# Patient Record
Sex: Female | Born: 1950 | Race: White | Hispanic: No | Marital: Married
Health system: Southern US, Community
[De-identification: ages and names within clinical notes are randomized; demographics above are authoritative.]

---

## 1994-09-28 HISTORY — PX: BREAST BIOPSY: SHX20

## 2005-03-05 ENCOUNTER — Ambulatory Visit: Payer: Self-pay | Admitting: Internal Medicine

## 2005-03-14 ENCOUNTER — Emergency Department: Payer: Self-pay | Admitting: Emergency Medicine

## 2006-05-26 ENCOUNTER — Ambulatory Visit: Payer: Self-pay | Admitting: Obstetrics and Gynecology

## 2007-06-09 ENCOUNTER — Ambulatory Visit: Payer: Self-pay | Admitting: Obstetrics and Gynecology

## 2007-07-01 ENCOUNTER — Ambulatory Visit: Payer: Self-pay | Admitting: Obstetrics and Gynecology

## 2008-06-13 ENCOUNTER — Ambulatory Visit: Payer: Self-pay | Admitting: Obstetrics and Gynecology

## 2009-10-02 ENCOUNTER — Ambulatory Visit: Payer: Self-pay | Admitting: Internal Medicine

## 2010-10-08 ENCOUNTER — Ambulatory Visit: Payer: Self-pay | Admitting: Internal Medicine

## 2010-10-08 ENCOUNTER — Ambulatory Visit: Payer: Self-pay | Admitting: Obstetrics and Gynecology

## 2011-12-09 ENCOUNTER — Ambulatory Visit: Payer: Self-pay | Admitting: Internal Medicine

## 2013-03-09 ENCOUNTER — Ambulatory Visit: Payer: Self-pay | Admitting: Internal Medicine

## 2013-09-15 ENCOUNTER — Ambulatory Visit: Payer: Self-pay | Admitting: Unknown Physician Specialty

## 2014-07-16 ENCOUNTER — Ambulatory Visit: Payer: Self-pay | Admitting: Internal Medicine

## 2015-01-07 ENCOUNTER — Telehealth: Payer: Self-pay | Admitting: Pulmonary Disease

## 2015-05-09 ENCOUNTER — Other Ambulatory Visit: Payer: Self-pay | Admitting: Internal Medicine

## 2015-05-09 DIAGNOSIS — Z1231 Encounter for screening mammogram for malignant neoplasm of breast: Secondary | ICD-10-CM

## 2015-07-18 ENCOUNTER — Ambulatory Visit: Payer: Self-pay

## 2015-07-26 ENCOUNTER — Ambulatory Visit
Admission: RE | Admit: 2015-07-26 | Discharge: 2015-07-26 | Disposition: A | Discharge status: Home / Self Care | Payer: BLUE CROSS/BLUE SHIELD | Source: Ambulatory Visit | Attending: Internal Medicine | Admitting: Internal Medicine

## 2015-07-26 DIAGNOSIS — Z1231 Encounter for screening mammogram for malignant neoplasm of breast: Secondary | ICD-10-CM | POA: Insufficient documentation

## 2016-07-03 ENCOUNTER — Other Ambulatory Visit: Payer: Self-pay | Admitting: Internal Medicine

## 2016-07-03 DIAGNOSIS — Z1231 Encounter for screening mammogram for malignant neoplasm of breast: Secondary | ICD-10-CM

## 2016-08-10 ENCOUNTER — Ambulatory Visit
Admission: RE | Admit: 2016-08-10 | Discharge: 2016-08-10 | Disposition: A | Discharge status: Home / Self Care | Payer: BLUE CROSS/BLUE SHIELD | Source: Ambulatory Visit | Attending: Internal Medicine | Admitting: Internal Medicine

## 2016-08-10 ENCOUNTER — Ambulatory Visit: Payer: BLUE CROSS/BLUE SHIELD

## 2016-08-10 DIAGNOSIS — Z1231 Encounter for screening mammogram for malignant neoplasm of breast: Secondary | ICD-10-CM

## 2016-09-15 ENCOUNTER — Ambulatory Visit: Payer: BLUE CROSS/BLUE SHIELD

## 2017-06-21 ENCOUNTER — Other Ambulatory Visit: Payer: Self-pay | Admitting: Internal Medicine

## 2017-06-21 DIAGNOSIS — Z1231 Encounter for screening mammogram for malignant neoplasm of breast: Secondary | ICD-10-CM

## 2017-08-11 ENCOUNTER — Ambulatory Visit
Admission: RE | Admit: 2017-08-11 | Discharge: 2017-08-11 | Disposition: A | Discharge status: Home / Self Care | Payer: BLUE CROSS/BLUE SHIELD | Source: Ambulatory Visit | Attending: Internal Medicine | Admitting: Internal Medicine

## 2017-08-11 DIAGNOSIS — Z1231 Encounter for screening mammogram for malignant neoplasm of breast: Secondary | ICD-10-CM

## 2018-07-12 ENCOUNTER — Other Ambulatory Visit: Payer: Self-pay | Admitting: Internal Medicine

## 2018-07-12 DIAGNOSIS — Z1231 Encounter for screening mammogram for malignant neoplasm of breast: Secondary | ICD-10-CM

## 2018-08-12 ENCOUNTER — Ambulatory Visit
Admission: RE | Admit: 2018-08-12 | Discharge: 2018-08-12 | Disposition: A | Discharge status: Home / Self Care | Payer: Medicare Other | Source: Ambulatory Visit | Attending: Internal Medicine | Admitting: Internal Medicine

## 2018-08-12 DIAGNOSIS — Z1231 Encounter for screening mammogram for malignant neoplasm of breast: Secondary | ICD-10-CM | POA: Insufficient documentation

## 2018-12-06 ENCOUNTER — Other Ambulatory Visit: Payer: Self-pay | Admitting: Internal Medicine

## 2018-12-06 DIAGNOSIS — M25862 Other specified joint disorders, left knee: Secondary | ICD-10-CM

## 2018-12-07 ENCOUNTER — Other Ambulatory Visit: Payer: Self-pay

## 2018-12-07 ENCOUNTER — Ambulatory Visit
Admission: RE | Admit: 2018-12-07 | Discharge: 2018-12-07 | Disposition: A | Discharge status: Home / Self Care | Payer: Medicare Other | Source: Ambulatory Visit | Attending: Internal Medicine | Admitting: Internal Medicine

## 2018-12-07 DIAGNOSIS — M25862 Other specified joint disorders, left knee: Secondary | ICD-10-CM | POA: Diagnosis present

## 2019-07-14 ENCOUNTER — Other Ambulatory Visit: Payer: Self-pay | Admitting: Internal Medicine

## 2019-07-14 DIAGNOSIS — Z1231 Encounter for screening mammogram for malignant neoplasm of breast: Secondary | ICD-10-CM

## 2019-08-14 ENCOUNTER — Ambulatory Visit
Admission: RE | Admit: 2019-08-14 | Discharge: 2019-08-14 | Disposition: A | Discharge status: Home / Self Care | Payer: Medicare Other | Source: Ambulatory Visit | Attending: Internal Medicine | Admitting: Internal Medicine

## 2019-08-14 DIAGNOSIS — Z1231 Encounter for screening mammogram for malignant neoplasm of breast: Secondary | ICD-10-CM

## 2019-10-28 ENCOUNTER — Ambulatory Visit: Payer: Medicare Other

## 2019-11-02 ENCOUNTER — Ambulatory Visit: Payer: Medicare Other

## 2020-07-03 ENCOUNTER — Other Ambulatory Visit: Payer: Medicare Other

## 2020-07-03 DIAGNOSIS — Z20822 Contact with and (suspected) exposure to covid-19: Secondary | ICD-10-CM

## 2020-07-04 LAB — SARS-COV-2, NAA 2 DAY TAT

## 2020-07-04 LAB — NOVEL CORONAVIRUS, NAA: SARS-CoV-2, NAA: NOT DETECTED

## 2020-07-23 ENCOUNTER — Other Ambulatory Visit: Payer: Self-pay | Admitting: Internal Medicine

## 2020-07-23 DIAGNOSIS — Z1231 Encounter for screening mammogram for malignant neoplasm of breast: Secondary | ICD-10-CM

## 2020-08-19 ENCOUNTER — Other Ambulatory Visit: Payer: Self-pay

## 2020-08-19 ENCOUNTER — Ambulatory Visit
Admission: RE | Admit: 2020-08-19 | Discharge: 2020-08-19 | Disposition: A | Discharge status: Home / Self Care | Payer: Medicare Other | Source: Ambulatory Visit | Attending: Internal Medicine | Admitting: Internal Medicine

## 2020-08-19 DIAGNOSIS — Z1231 Encounter for screening mammogram for malignant neoplasm of breast: Secondary | ICD-10-CM | POA: Diagnosis not present

## 2021-07-25 ENCOUNTER — Other Ambulatory Visit: Payer: Self-pay | Admitting: Internal Medicine

## 2021-07-25 DIAGNOSIS — Z1231 Encounter for screening mammogram for malignant neoplasm of breast: Secondary | ICD-10-CM

## 2021-08-25 ENCOUNTER — Ambulatory Visit
Admission: RE | Admit: 2021-08-25 | Discharge: 2021-08-25 | Disposition: A | Discharge status: Home / Self Care | Payer: Medicare Other | Source: Ambulatory Visit | Attending: Internal Medicine | Admitting: Internal Medicine

## 2021-08-25 ENCOUNTER — Other Ambulatory Visit: Payer: Self-pay

## 2021-08-25 DIAGNOSIS — Z1231 Encounter for screening mammogram for malignant neoplasm of breast: Secondary | ICD-10-CM | POA: Diagnosis not present

## 2022-03-30 ENCOUNTER — Emergency Department
Admission: EM | Admit: 2022-03-30 | Discharge: 2022-03-30 | Disposition: A | Discharge status: Home / Self Care | Payer: Medicare Other | Attending: Emergency Medicine | Admitting: Emergency Medicine

## 2022-03-30 ENCOUNTER — Other Ambulatory Visit: Payer: Self-pay

## 2022-03-30 ENCOUNTER — Emergency Department: Payer: Medicare Other

## 2022-03-30 DIAGNOSIS — Z808 Family history of malignant neoplasm of other organs or systems: Secondary | ICD-10-CM | POA: Insufficient documentation

## 2022-03-30 DIAGNOSIS — H5702 Anisocoria: Secondary | ICD-10-CM | POA: Insufficient documentation

## 2022-03-30 NOTE — ED Provider Notes (Signed)
[facility] Provider Note    Event Date/Time   First MD Initiated Contact with Patient 03/30/22 1004     (approximate)   History   Eye Problem   HPI  Stacy Alvarez is a 71 y.o. female presents to the emergency department for treatment and evaluation of left pupil being larger than the right pupil.  She has never noticed this before.  She is not having any other symptoms such as headache, change in vision, photophobia weakness, slurred speech, eye pain or eye redness.  She denies having had eye conditions such as glaucoma or ocular hypertension.  Her last eye exam was last July.  She states that one of the nurses on the neurology floor at [facility] noticed it.  Patient was there with her sister who has been diagnosed with brain cancer.  She states that one of the neurologist looked in her eyes and looked at her pupils and did not recommend that she go to the emergency department, but when she looked online to research what might cause a different sized pupils she became increasingly concerned.  History reviewed. No pertinent past medical history.   Physical Exam   Triage Vital Signs: ED Triage Vitals  Enc Vitals Group     BP 03/30/22 0954 136/71     Pulse Rate 03/30/22 0954 92     Resp 03/30/22 0954 16     Temp 03/30/22 0954 98.4 F (36.9 C)     Temp Source 03/30/22 0954 Oral     SpO2 03/30/22 0954 100 %     Weight --      Height --      Head Circumference --      Peak Flow --      Pain Score 03/30/22 0933 0     Pain Loc --      Pain Edu? --      Excl. in GC? --     Most recent vital signs: Vitals:   03/30/22 0954  BP: 136/71  Pulse: 92  Resp: 16  Temp: 98.4 F (36.9 C)  SpO2: 100%    General: Awake, no distress.  CV:  Good peripheral perfusion.  Resp:  Normal effort.  Abd:  No distention.  Other:  Left pupil 3 mm, right pupil 2 mm.  Both pupils are brisk.  No change in difference of pupil size in the light or dark.   ED Results  / Procedures / Treatments   Labs (all labs ordered are listed, but only abnormal results are displayed) Labs Reviewed - No data to display   EKG  Not indicated   RADIOLOGY  CT head negative for acute concerns.  I have independently reviewed and interpreted imaging as well as reviewed report from radiology.  PROCEDURES:  Critical Care performed: No  Procedures   MEDICATIONS ORDERED IN ED:  Medications - No data to display   IMPRESSION / MDM / ASSESSMENT AND PLAN / ED COURSE   I reviewed the triage vital signs and the nursing notes.  Differential diagnosis includes, but is not limited to: Anisocoria, physiologic anisocoria, Horner syndrome, tonic pupil, ocular CVA  Patient's presentation is most consistent with acute complicated illness / injury requiring diagnostic workup.  71 year old female presenting to the emergency department for treatment and evaluation after noticing that her left pupil being larger than the right.  See HPI for further details.  Her exam is overall reassuring.  Size difference of pupils does not change in light  or dark. Plan will be to get head CT.  CT negative for acute concerns. Patient feels somewhat reassured and will follow up with [facility]. She is established with Dr. Brooke Dare. She is to call for an appointment on Wednesday as the office will be closed tomorrow for July 4. She is to return to the ER for any concerning symptoms in the meantime.      FINAL CLINICAL IMPRESSION(S) / ED DIAGNOSES   Final diagnoses:  Anisocoria     Rx / DC Orders   ED Discharge Orders     None        Note:  This document was prepared using Dragon voice recognition software and may include unintentional dictation errors.   [name], FNP 03/31/22 2025    Sharman Cheek, MD 04/05/22 Marlyne Beards

## 2022-03-30 NOTE — ED Notes (Signed)
Left pupil bigger than right. Pt denies pain, changes in vision, use of eye drops or contacts, trauma.

## 2022-03-30 NOTE — Discharge Instructions (Signed)
Please call and schedule an appointment with Dr. Brooke Dare or one of his associates.  If you develop any other concerning symptoms before he can see you, please return to the ER.

## 2022-03-30 NOTE — ED Triage Notes (Signed)
Pt comes with c/o left eye bigger than right. Pt states someone noticed it last week and pointed it out to her. Pt states her PCP advised her to come to ED. Pt denies any blurry vision, dizziness, headache or any other symptoms.

## 2022-07-14 ENCOUNTER — Other Ambulatory Visit: Payer: Self-pay | Admitting: Internal Medicine

## 2022-07-14 DIAGNOSIS — Z1231 Encounter for screening mammogram for malignant neoplasm of breast: Secondary | ICD-10-CM

## 2022-08-17 IMAGING — MG MM DIGITAL SCREENING BILAT W/ TOMO AND CAD
8 series · 9 of 24 positions shown · non-contrast
Comparison: Previous exam(s).

CLINICAL DATA: Screening.

EXAM:
DIGITAL SCREENING BILATERAL MAMMOGRAM WITH TOMOSYNTHESIS AND CAD
TECHNIQUE: Bilateral screening digital craniocaudal and mediolateral oblique
mammograms were obtained. Bilateral screening digital breast
tomosynthesis was performed. The images were evaluated with
computer-aided detection.

[L MLO synth-2D]
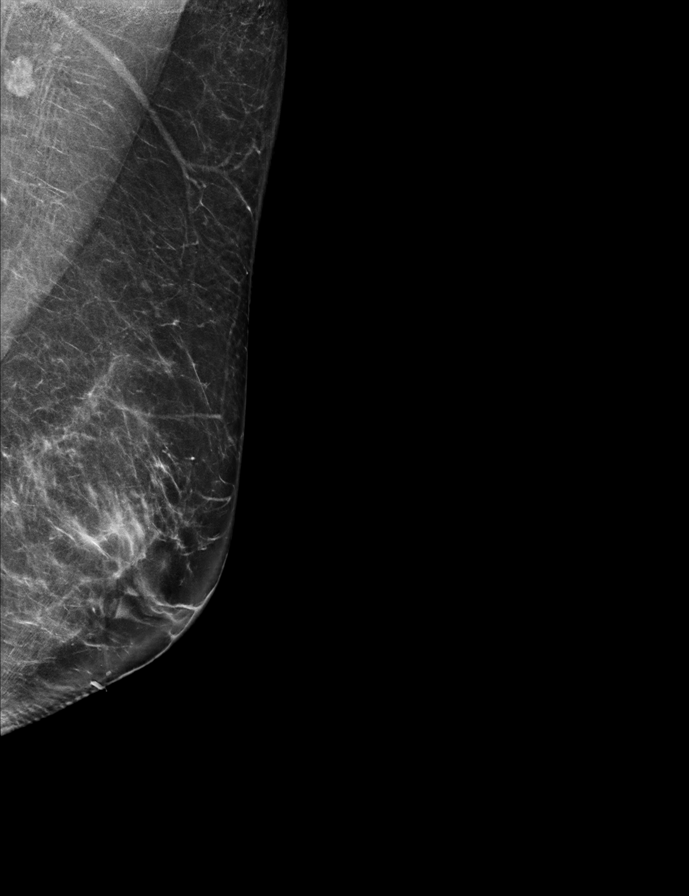

[L CC synth-2D]
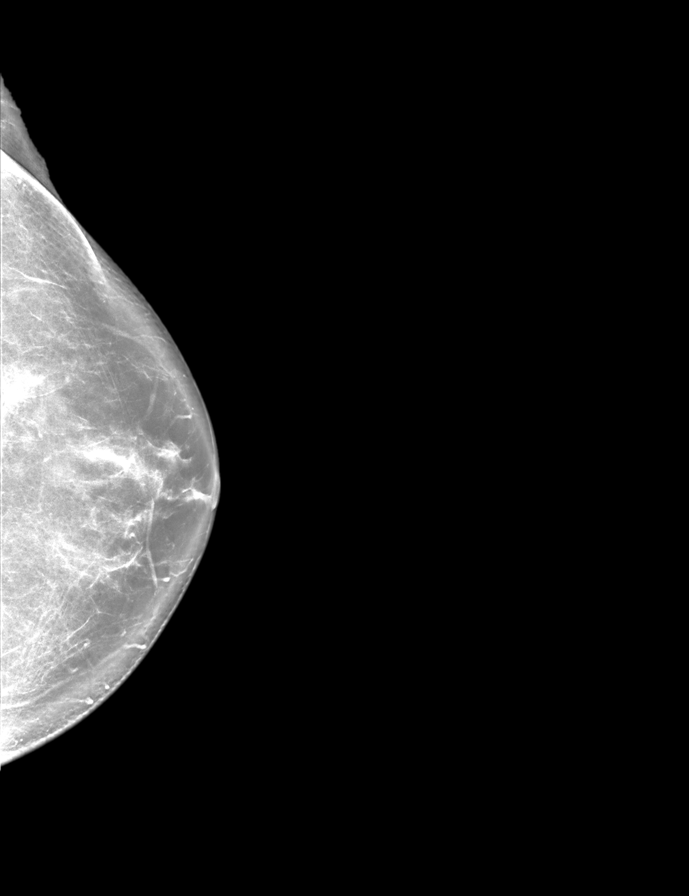

[R CC synth-2D]
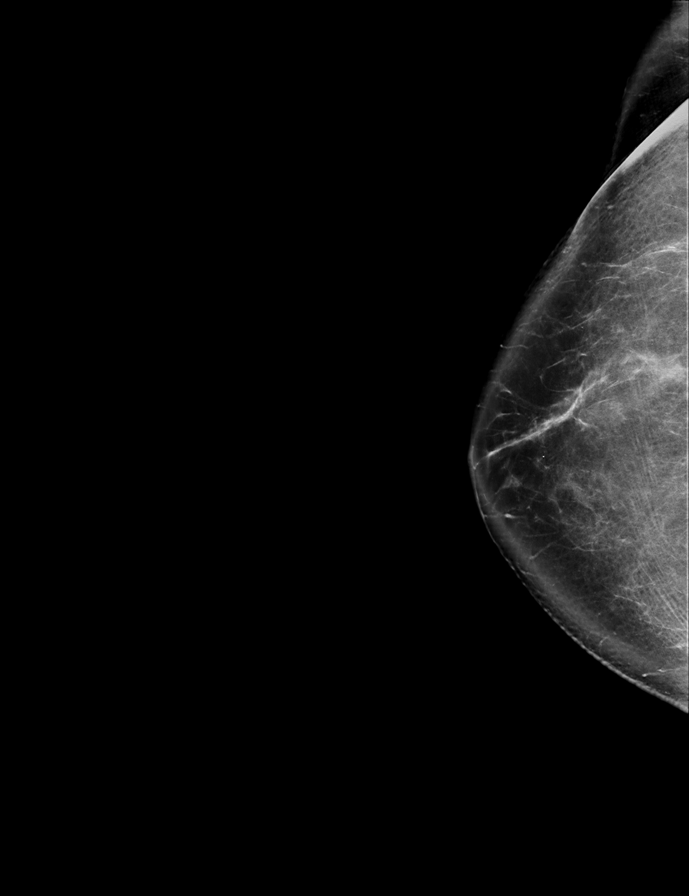

[R MLO synth-2D]
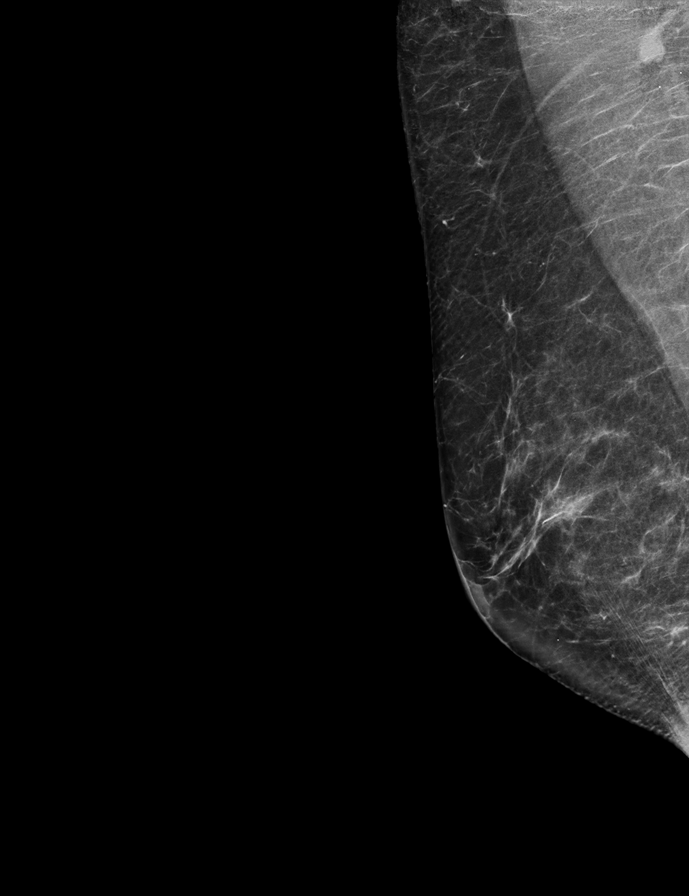

[R CC tomo · 2 of 84 frames shown]
[frame 28/84]
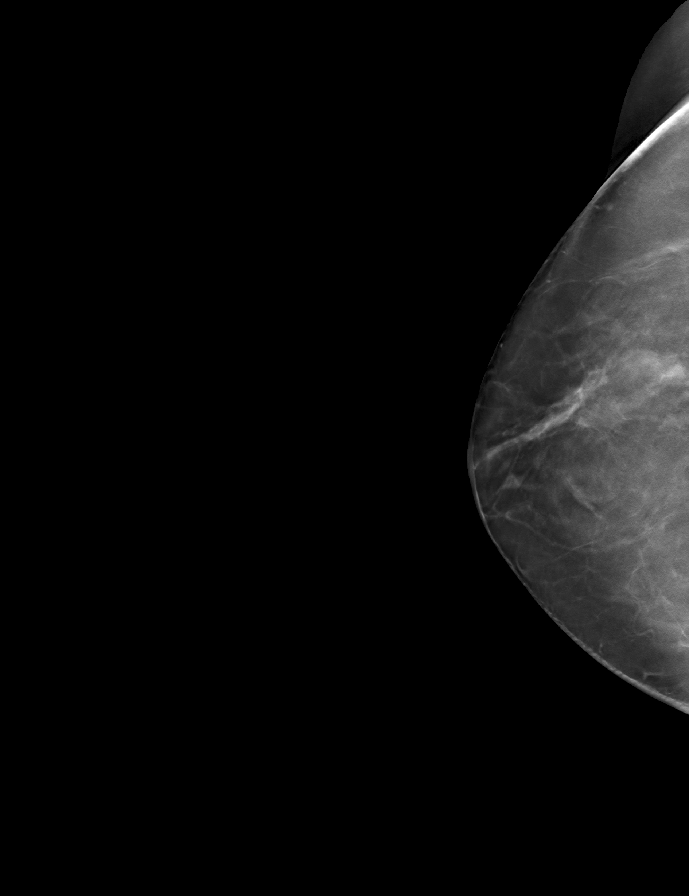
[frame 43/84]
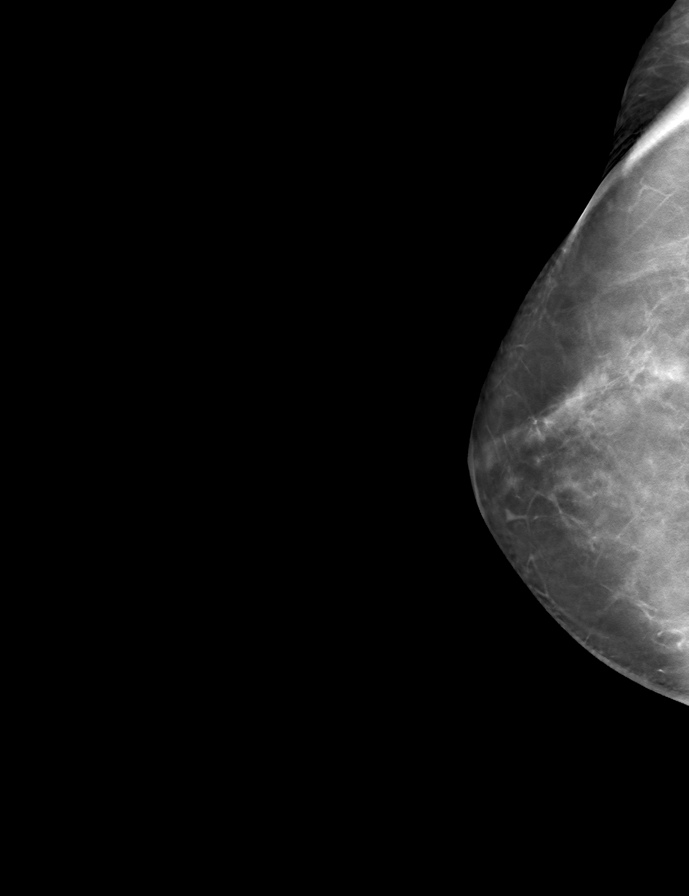

[R MLO tomo · tomo slice 37/72.0]
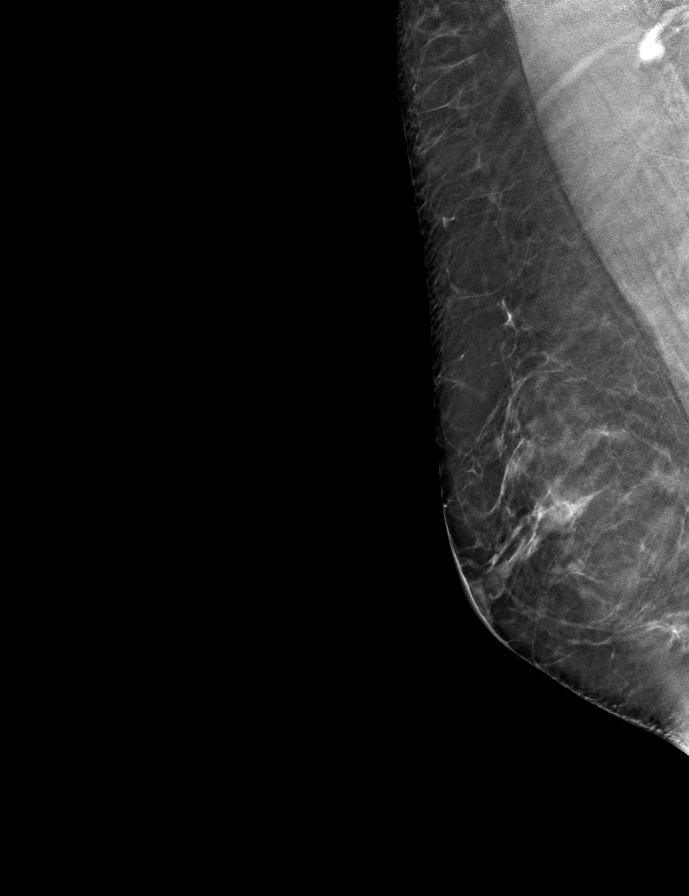

[L MLO tomo · tomo slice 37/72.0]
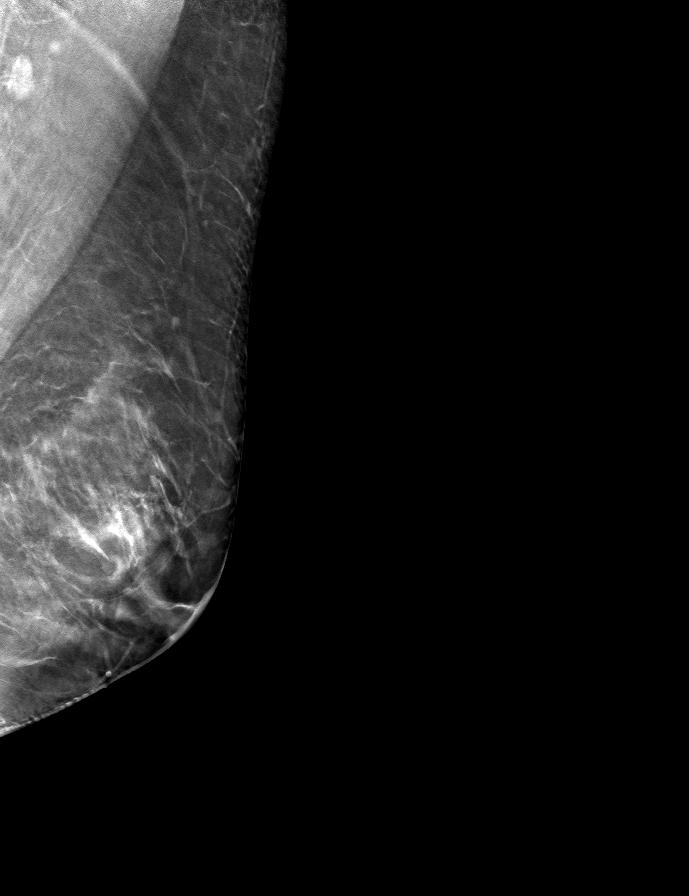

[L CC tomo · tomo slice 41/81.0]
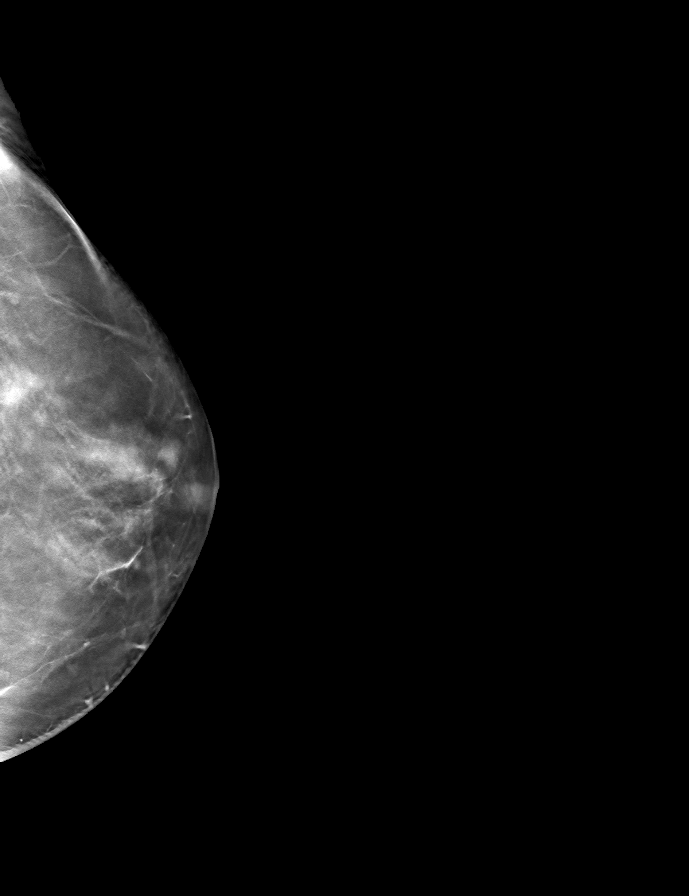

[9 of 24 positions shown; findings below may reference images not displayed]

ACR Breast Density Category b: There are scattered areas of
fibroglandular density.
FINDINGS: There are no findings suspicious for malignancy.
IMPRESSION: No mammographic evidence of malignancy. A result letter of this
screening mammogram will be mailed directly to the patient.

RECOMMENDATION:
Screening mammogram in one year. (Code:51-O-LD2)

BI-RADS CATEGORY  1: Negative.

## 2022-09-01 ENCOUNTER — Ambulatory Visit
Admission: RE | Admit: 2022-09-01 | Discharge: 2022-09-01 | Disposition: A | Discharge status: Home / Self Care | Payer: Medicare Other | Source: Ambulatory Visit | Attending: Internal Medicine | Admitting: Internal Medicine

## 2022-09-01 DIAGNOSIS — Z1231 Encounter for screening mammogram for malignant neoplasm of breast: Secondary | ICD-10-CM | POA: Insufficient documentation

## 2022-09-09 ENCOUNTER — Other Ambulatory Visit: Payer: Self-pay | Admitting: Adult Health Nurse Practitioner

## 2022-09-09 DIAGNOSIS — R7989 Other specified abnormal findings of blood chemistry: Secondary | ICD-10-CM

## 2022-10-03 ENCOUNTER — Ambulatory Visit
Admission: RE | Admit: 2022-10-03 | Discharge: 2022-10-03 | Disposition: A | Discharge status: Home / Self Care | Payer: Medicare Other | Source: Ambulatory Visit | Attending: Adult Health Nurse Practitioner | Admitting: Adult Health Nurse Practitioner

## 2022-10-03 DIAGNOSIS — R7989 Other specified abnormal findings of blood chemistry: Secondary | ICD-10-CM

## 2022-10-03 MED ORDER — GADOPICLENOL 0.5 MMOL/ML IV SOLN
6.0000 mL | Freq: Once | INTRAVENOUS | Status: AC | PRN
  Administered 2022-10-03: 6 mL via INTRAVENOUS

## 2022-10-06 ENCOUNTER — Encounter: Payer: Self-pay | Admitting: Adult Health Nurse Practitioner

## 2023-08-11 ENCOUNTER — Other Ambulatory Visit: Payer: Self-pay | Admitting: Internal Medicine

## 2023-08-11 DIAGNOSIS — Z1231 Encounter for screening mammogram for malignant neoplasm of breast: Secondary | ICD-10-CM

## 2023-08-16 ENCOUNTER — Telehealth: Payer: Self-pay | Admitting: Family Medicine

## 2023-08-16 NOTE — Telephone Encounter (Signed)
Pt is calling in because her dad Stacy Alvarez (Date of birth: [birth year]) who was one of his patients passed away last week and the funeral home is trying to finalize the death certificate. The cause of death states Alzheimers/Dementia and [name] says that [name] never had Alzheimers and she would like to clear this up so the correct information is on the certificate. [name] says this is an urgent matter and is requesting a call back today to get this sorted out. She can be reached at [phone]

## 2023-09-09 ENCOUNTER — Ambulatory Visit
Admission: RE | Admit: 2023-09-09 | Discharge: 2023-09-09 | Disposition: A | Discharge status: Home / Self Care | Payer: Medicare Other | Source: Ambulatory Visit | Attending: Internal Medicine | Admitting: Internal Medicine

## 2023-09-09 DIAGNOSIS — Z1231 Encounter for screening mammogram for malignant neoplasm of breast: Secondary | ICD-10-CM | POA: Insufficient documentation

## 2024-04-19 NOTE — Telephone Encounter (Signed)
 Stacy Alvarez

## 2024-07-05 ENCOUNTER — Other Ambulatory Visit: Payer: Self-pay | Admitting: Nurse Practitioner

## 2024-07-05 DIAGNOSIS — K862 Cyst of pancreas: Secondary | ICD-10-CM

## 2024-07-05 DIAGNOSIS — K7689 Other specified diseases of liver: Secondary | ICD-10-CM

## 2024-08-31 ENCOUNTER — Ambulatory Visit
Admission: RE | Admit: 2024-08-31 | Discharge: 2024-08-31 | Disposition: A | Source: Ambulatory Visit | Attending: Nurse Practitioner

## 2024-08-31 DIAGNOSIS — K7689 Other specified diseases of liver: Secondary | ICD-10-CM

## 2024-08-31 DIAGNOSIS — K862 Cyst of pancreas: Secondary | ICD-10-CM

## 2024-08-31 MED ORDER — GADOPICLENOL 0.5 MMOL/ML IV SOLN
7.5000 mL | Freq: Once | INTRAVENOUS | Status: AC | PRN
  Administered 2024-08-31: 5 mL via INTRAVENOUS

## 2024-09-05 ENCOUNTER — Other Ambulatory Visit: Payer: Self-pay | Admitting: Internal Medicine

## 2024-09-05 DIAGNOSIS — Z1231 Encounter for screening mammogram for malignant neoplasm of breast: Secondary | ICD-10-CM

## 2024-10-12 ENCOUNTER — Ambulatory Visit
Admission: RE | Admit: 2024-10-12 | Discharge: 2024-10-12 | Disposition: A | Discharge status: Home / Self Care | Source: Ambulatory Visit | Attending: Internal Medicine | Admitting: Internal Medicine

## 2024-10-12 DIAGNOSIS — Z1231 Encounter for screening mammogram for malignant neoplasm of breast: Secondary | ICD-10-CM | POA: Diagnosis present
